# Patient Record
Sex: Male | Born: 1980 | Race: Black or African American | Hispanic: No | Marital: Single | Smoking: Current every day smoker
Health system: Southern US, Community
[De-identification: ages and names within clinical notes are randomized; demographics above are authoritative.]

## PROBLEM LIST (undated history)

## (undated) DIAGNOSIS — S2249XA Multiple fractures of ribs, unspecified side, initial encounter for closed fracture: Secondary | ICD-10-CM

## (undated) DIAGNOSIS — S2239XA Fracture of one rib, unspecified side, initial encounter for closed fracture: Secondary | ICD-10-CM

---

## 2006-04-02 ENCOUNTER — Emergency Department: Payer: Self-pay | Admitting: Emergency Medicine

## 2006-08-05 ENCOUNTER — Emergency Department: Payer: Self-pay | Admitting: Emergency Medicine

## 2006-12-04 ENCOUNTER — Emergency Department: Payer: Self-pay | Admitting: Emergency Medicine

## 2007-04-09 ENCOUNTER — Emergency Department: Payer: Self-pay | Admitting: Emergency Medicine

## 2007-08-25 ENCOUNTER — Emergency Department: Payer: Self-pay | Admitting: Internal Medicine

## 2007-09-01 ENCOUNTER — Emergency Department: Payer: Self-pay | Admitting: Internal Medicine

## 2008-01-17 ENCOUNTER — Emergency Department: Payer: Self-pay | Admitting: Emergency Medicine

## 2008-10-21 ENCOUNTER — Emergency Department: Payer: Self-pay | Admitting: Emergency Medicine

## 2009-05-22 ENCOUNTER — Emergency Department: Payer: Self-pay | Admitting: Unknown Physician Specialty

## 2009-08-07 ENCOUNTER — Emergency Department: Payer: Self-pay | Admitting: Emergency Medicine

## 2010-08-14 ENCOUNTER — Emergency Department: Payer: Self-pay | Admitting: Emergency Medicine

## 2013-10-28 ENCOUNTER — Emergency Department: Payer: Self-pay | Admitting: Emergency Medicine

## 2013-11-04 ENCOUNTER — Emergency Department: Payer: Self-pay | Admitting: Emergency Medicine

## 2014-09-10 ENCOUNTER — Emergency Department: Payer: Self-pay | Admitting: Emergency Medicine

## 2015-06-01 ENCOUNTER — Encounter: Payer: Self-pay | Admitting: *Deleted

## 2015-06-01 DIAGNOSIS — K0889 Other specified disorders of teeth and supporting structures: Secondary | ICD-10-CM | POA: Insufficient documentation

## 2015-06-01 DIAGNOSIS — Z720 Tobacco use: Secondary | ICD-10-CM | POA: Insufficient documentation

## 2015-06-01 DIAGNOSIS — K029 Dental caries, unspecified: Secondary | ICD-10-CM | POA: Insufficient documentation

## 2015-06-01 DIAGNOSIS — K0501 Acute gingivitis, non-plaque induced: Secondary | ICD-10-CM | POA: Insufficient documentation

## 2015-06-01 NOTE — ED Notes (Signed)
Pt has left lower toothache since yesterday.  Pt taking otc meds without relief.  Pt states he took 3 percocets tonight without relief.

## 2015-06-02 ENCOUNTER — Emergency Department
Admission: EM | Admit: 2015-06-02 | Discharge: 2015-06-02 | Disposition: A | Discharge status: Home / Self Care | Payer: Self-pay | Attending: Emergency Medicine | Admitting: Emergency Medicine

## 2015-06-02 ENCOUNTER — Encounter: Payer: Self-pay | Admitting: Emergency Medicine

## 2015-06-02 DIAGNOSIS — K0889 Other specified disorders of teeth and supporting structures: Secondary | ICD-10-CM

## 2015-06-02 DIAGNOSIS — K029 Dental caries, unspecified: Secondary | ICD-10-CM

## 2015-06-02 MED ORDER — LIDOCAINE VISCOUS 2 % MT SOLN
OROMUCOSAL | Status: AC
  Administered 2015-06-02: 15 mL via OROMUCOSAL
  Filled 2015-06-02: qty 45

## 2015-06-02 MED ORDER — KETOROLAC TROMETHAMINE 10 MG PO TABS
10.0000 mg | ORAL_TABLET | Freq: Once | ORAL | Status: AC
  Administered 2015-06-02: 10 mg via ORAL
  Filled 2015-06-02: qty 1

## 2015-06-02 MED ORDER — AMOXICILLIN-POT CLAVULANATE 875-125 MG PO TABS
1.0000 | ORAL_TABLET | Freq: Once | ORAL | Status: AC
  Administered 2015-06-02: 1 via ORAL

## 2015-06-02 MED ORDER — LIDOCAINE VISCOUS 2 % MT SOLN
15.0000 mL | Freq: Once | OROMUCOSAL | Status: AC
  Administered 2015-06-02: 15 mL via OROMUCOSAL

## 2015-06-02 MED ORDER — KETOROLAC TROMETHAMINE 10 MG PO TABS: 10.0000 mg | ORAL_TABLET | Freq: Three times a day (TID) | ORAL | Status: DC | PRN

## 2015-06-02 MED ORDER — HYDROCODONE-ACETAMINOPHEN 5-325 MG PO TABS
ORAL_TABLET | ORAL | Status: AC
  Administered 2015-06-02: 2 via ORAL
  Filled 2015-06-02: qty 2

## 2015-06-02 MED ORDER — AMOXICILLIN-POT CLAVULANATE 875-125 MG PO TABS: 1.0000 | ORAL_TABLET | Freq: Two times a day (BID) | ORAL | Status: DC

## 2015-06-02 MED ORDER — HYDROCODONE-ACETAMINOPHEN 5-325 MG PO TABS
2.0000 | ORAL_TABLET | Freq: Once | ORAL | Status: AC
  Administered 2015-06-02: 2 via ORAL

## 2015-06-02 MED ORDER — AMOXICILLIN-POT CLAVULANATE 875-125 MG PO TABS
ORAL_TABLET | ORAL | Status: AC
  Administered 2015-06-02: 1 via ORAL
  Filled 2015-06-02: qty 1

## 2015-06-02 NOTE — ED Notes (Signed)
Pt in with co toothache since Tuesday, states he has been told it needs to be extracted but has not been able to see dentist.

## 2015-06-02 NOTE — Discharge Instructions (Signed)

## 2015-06-02 NOTE — ED Provider Notes (Signed)
[facility] Emergency Department Provider Note  ____________________________________________  Time seen: 1:45 AM  I have reviewed the triage vital signs and the nursing notes.   HISTORY  Chief Complaint Dental Pain      HPI Gregory Palmer is a 34 y.o. male presents with history of left mandibular molar pain times one day without relief patient states he took 3 Percocets which rendered no relief. She denies any fever no nausea or vomiting     Past medical history None There are no active problems to display for this patient.  Surgical history None No current outpatient prescriptions on file.  Allergies No known drug allergies No family history on file.  Social History Social History  Substance Use Topics  . Smoking status: Current Every Day Smoker  . Smokeless tobacco: None  . Alcohol Use: Yes    Review of Systems  Constitutional: Negative for fever. Eyes: Negative for visual changes. ENT: Negative for sore throat. Positive for dental pain Cardiovascular: Negative for chest pain. Respiratory: Negative for shortness of breath. Gastrointestinal: Negative for abdominal pain, vomiting and diarrhea. Genitourinary: Negative for dysuria. Musculoskeletal: Negative for back pain. Skin: Negative for rash. Neurological: Negative for headaches, focal weakness or numbness.   10-point ROS otherwise negative.  ____________________________________________   PHYSICAL EXAM:  VITAL SIGNS: ED Triage Vitals  Enc Vitals Group     BP 06/01/15 2354 138/90 mmHg     Pulse Rate 06/01/15 2354 60     Resp 06/01/15 2354 18     Temp 06/01/15 2354 97.5 F (36.4 C)     Temp Source 06/01/15 2354 Oral     SpO2 06/01/15 2354 95 %     Weight 06/01/15 2354 150 lb (68.04 kg)     Height 06/01/15 2354 5\' 6"  (1.676 m)     Head Cir --      Peak Flow --      Pain Score 06/01/15 2352 10     Pain Loc --      Pain Edu? --      Excl. in GC? --      Constitutional: Alert and oriented. Well appearing and in no distress. Eyes: Conjunctivae are normal. PERRL. Normal extraocular movements. ENT   Head: Normocephalic and atraumatic.   Nose: No congestion/rhinnorhea.   Mouth/Throat: Mucous membranes are moist. Positive gingivitis. Erythema at the base of the posterior left molar   Neck: No stridor. Hematological/Lymphatic/Immunilogical: No cervical lymphadenopathy. Cardiovascular: Normal rate, regular rhythm. Normal and symmetric distal pulses are present in all extremities. No murmurs, rubs, or gallops. Respiratory: Normal respiratory effort without tachypnea nor retractions. Breath sounds are clear and equal bilaterally. No wheezes/rales/rhonchi. Gastrointestinal: Soft and nontender. No distention. There is no CVA tenderness. Genitourinary: deferred Musculoskeletal: Nontender with normal range of motion in all extremities. No joint effusions.  No lower extremity tenderness nor edema. Neurologic:  Normal speech and language. No gross focal neurologic deficits are appreciated. Speech is normal.  Skin:  Skin is warm, dry and intact. No rash noted. Psychiatric: Mood and affect are normal. Speech and behavior are normal. Patient exhibits appropriate insight and judgment.     INITIAL IMPRESSION / ASSESSMENT AND PLAN / ED COURSE  Pertinent labs & imaging results that were available during my care of the patient were reviewed by me and considered in my medical decision making (see chart for details).    ____________________________________________   FINAL CLINICAL IMPRESSION(S) / ED DIAGNOSES  Final diagnoses:  Pain, dental  Dental caries  Darci Current, MD 06/03/15 [phone]

## 2016-08-22 ENCOUNTER — Emergency Department
Admission: EM | Admit: 2016-08-22 | Discharge: 2016-08-22 | Disposition: A | Discharge status: Home / Self Care | Payer: Self-pay | Attending: Emergency Medicine | Admitting: Emergency Medicine

## 2016-08-22 ENCOUNTER — Emergency Department: Payer: Self-pay

## 2016-08-22 DIAGNOSIS — F17200 Nicotine dependence, unspecified, uncomplicated: Secondary | ICD-10-CM | POA: Insufficient documentation

## 2016-08-22 DIAGNOSIS — Y929 Unspecified place or not applicable: Secondary | ICD-10-CM | POA: Insufficient documentation

## 2016-08-22 DIAGNOSIS — Y999 Unspecified external cause status: Secondary | ICD-10-CM | POA: Insufficient documentation

## 2016-08-22 DIAGNOSIS — Z791 Long term (current) use of non-steroidal anti-inflammatories (NSAID): Secondary | ICD-10-CM | POA: Insufficient documentation

## 2016-08-22 DIAGNOSIS — S89301A Unspecified physeal fracture of lower end of right fibula, initial encounter for closed fracture: Secondary | ICD-10-CM | POA: Insufficient documentation

## 2016-08-22 DIAGNOSIS — Y9389 Activity, other specified: Secondary | ICD-10-CM | POA: Insufficient documentation

## 2016-08-22 DIAGNOSIS — S82831A Other fracture of upper and lower end of right fibula, initial encounter for closed fracture: Secondary | ICD-10-CM

## 2016-08-22 DIAGNOSIS — W228XXA Striking against or struck by other objects, initial encounter: Secondary | ICD-10-CM | POA: Insufficient documentation

## 2016-08-22 MED ORDER — HYDROCODONE-ACETAMINOPHEN 5-325 MG PO TABS
1.0000 | ORAL_TABLET | Freq: Once | ORAL | Status: AC
  Administered 2016-08-22: 1 via ORAL
  Filled 2016-08-22: qty 1

## 2016-08-22 MED ORDER — TRAMADOL HCL 50 MG PO TABS
50.0000 mg | ORAL_TABLET | Freq: Four times a day (QID) | ORAL | 0 refills | Status: AC | PRN
Start: 2016-08-22 — End: 2017-08-22
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
## 2016-11-28 ENCOUNTER — Emergency Department
Admission: EM | Admit: 2016-11-28 | Discharge: 2016-11-28 | Disposition: A | Discharge status: Home / Self Care | Payer: Self-pay | Attending: Student in an Organized Health Care Education/Training Program | Admitting: Student in an Organized Health Care Education/Training Program

## 2016-11-28 ENCOUNTER — Encounter: Payer: Self-pay | Admitting: *Deleted

## 2016-11-28 ENCOUNTER — Emergency Department: Payer: Self-pay

## 2016-11-28 DIAGNOSIS — R51 Headache: Secondary | ICD-10-CM | POA: Insufficient documentation

## 2016-11-28 DIAGNOSIS — R519 Headache, unspecified: Secondary | ICD-10-CM

## 2016-11-28 DIAGNOSIS — R42 Dizziness and giddiness: Secondary | ICD-10-CM | POA: Insufficient documentation

## 2016-11-28 DIAGNOSIS — F17200 Nicotine dependence, unspecified, uncomplicated: Secondary | ICD-10-CM | POA: Insufficient documentation

## 2016-11-28 LAB — DIFFERENTIAL
BASOS ABS: 0.1 10*3/uL (ref 0–0.1)
BASOS PCT: 0 %
EOS ABS: 0.1 10*3/uL (ref 0–0.7)
Eosinophils Relative: 1 %
LYMPHS ABS: 1.8 10*3/uL (ref 1.0–3.6)
Lymphocytes Relative: 10 %
Monocytes Absolute: 1 10*3/uL (ref 0.2–1.0)
Monocytes Relative: 6 %
NEUTROS PCT: 83 %
Neutro Abs: 14.6 10*3/uL — ABNORMAL HIGH (ref 1.4–6.5)

## 2016-11-28 LAB — BASIC METABOLIC PANEL
ANION GAP: 11 (ref 5–15)
BUN: 9 mg/dL (ref 6–20)
CALCIUM: 9.3 mg/dL (ref 8.9–10.3)
CO2: 26 mmol/L (ref 22–32)
Chloride: 103 mmol/L (ref 101–111)
Creatinine, Ser: 0.97 mg/dL (ref 0.61–1.24)
GFR calc Af Amer: >60 mL/min (ref >60)
GLUCOSE: 90 mg/dL (ref 65–99)
Potassium: 3.6 mmol/L (ref 3.5–5.1)
SODIUM: 140 mmol/L (ref 135–145)

## 2016-11-28 LAB — CBC
HCT: 43.4 % (ref 40.0–52.0)
HEMOGLOBIN: 15.1 g/dL (ref 13.0–18.0)
MCH: 28.1 pg (ref 26.0–34.0)
MCHC: 34.8 g/dL (ref 32.0–36.0)
MCV: 80.8 fL (ref 80.0–100.0)
Platelets: 331 10*3/uL (ref 150–440)
RBC: 5.37 MIL/uL (ref 4.40–5.90)
RDW: 15 % — AB (ref 11.5–14.5)
WBC: 17.5 10*3/uL — ABNORMAL HIGH (ref 3.8–10.6)

## 2016-11-28 MED ORDER — PROCHLORPERAZINE EDISYLATE 5 MG/ML IJ SOLN
10.0000 mg | Freq: Once | INTRAMUSCULAR | Status: AC
  Administered 2016-11-28: 10 mg via INTRAMUSCULAR
  Filled 2016-11-28 (×2): qty 2

## 2016-11-28 MED ORDER — KETOROLAC TROMETHAMINE 30 MG/ML IJ SOLN
INTRAMUSCULAR | Status: AC
  Administered 2016-11-28: 15 mg via INTRAVENOUS
  Filled 2016-11-28: qty 1

## 2016-11-28 MED ORDER — AMOXICILLIN-POT CLAVULANATE 875-125 MG PO TABS: 1.0000 | ORAL_TABLET | Freq: Two times a day (BID) | ORAL | 0 refills | Status: DC

## 2016-11-28 MED ORDER — SODIUM CHLORIDE 0.9 % IV BOLUS (SEPSIS)
1000.0000 mL | Freq: Once | INTRAVENOUS | Status: AC
  Administered 2016-11-28: 1000 mL via INTRAVENOUS

## 2016-11-28 MED ORDER — ACETAMINOPHEN 500 MG PO TABS
1000.0000 mg | ORAL_TABLET | Freq: Once | ORAL | Status: AC
  Administered 2016-11-28: 1000 mg via ORAL
  Filled 2016-11-28: qty 2

## 2016-11-28 MED ORDER — DEXAMETHASONE SODIUM PHOSPHATE 10 MG/ML IJ SOLN
INTRAMUSCULAR | Status: AC
  Administered 2016-11-28: 10 mg via INTRAVENOUS
  Filled 2016-11-28: qty 1

## 2016-11-28 MED ORDER — DEXAMETHASONE SODIUM PHOSPHATE 10 MG/ML IJ SOLN
10.0000 mg | Freq: Once | INTRAMUSCULAR | Status: AC
  Administered 2016-11-28: 10 mg via INTRAVENOUS

## 2016-11-28 MED ORDER — KETOROLAC TROMETHAMINE 30 MG/ML IJ SOLN
15.0000 mg | Freq: Once | INTRAMUSCULAR | Status: AC
  Administered 2016-11-28: 15 mg via INTRAVENOUS

## 2016-11-28 NOTE — Discharge Instructions (Signed)

## 2016-11-28 NOTE — ED Provider Notes (Signed)
[facility] Emergency Department Provider Note    First MD Initiated Contact with Patient 11/28/16 1600     (approximate)  I have reviewed the triage vital signs and the nursing notes.   HISTORY  Chief Complaint Headache    HPI Gregory Palmer is a 36 y.o. male presents with headache and dizziness that started this morning. Was associated with feeling sweaty. Denies any head injury. No blurry vision. States the headache is in the frontal aspect. No eye pain. Is having increased nasal congestion. No cough. No sore throat. No history of headaches. Denies any numbness or tingling. No history of diabetes.   History reviewed. No pertinent past medical history. FMH: no bleeding disorders History reviewed. No pertinent surgical history. There are no active problems to display for this patient.     Prior to Admission medications   Medication Sig Start Date End Date Taking? Authorizing Provider  amoxicillin-clavulanate (AUGMENTIN) 875-125 MG tablet Take 1 tablet by mouth 2 (two) times daily. 11/28/16   Willy Eddyobinson, Zema Lizardo, MD  ketorolac (TORADOL) 10 MG tablet Take 1 tablet (10 mg total) by mouth every 8 (eight) hours as needed. 06/02/15   Darci CurrentBrown, [place] N, MD  traMADol (ULTRAM) 50 MG tablet Take 1 tablet (50 mg total) by mouth every 6 (six) hours as needed. 08/22/16 08/22/17  Enid DerryWagner, Ashley, PA-C    Allergies Patient has no known allergies.    Social History Social History  Substance Use Topics  . Smoking status: Current Every Day Smoker  . Smokeless tobacco: Never Used  . Alcohol use Yes    Review of Systems Patient denies headaches, rhinorrhea, blurry vision, numbness, shortness of breath, chest pain, edema, cough, abdominal pain, nausea, vomiting, diarrhea, dysuria, fevers, rashes or hallucinations unless otherwise stated above in HPI. ____________________________________________   PHYSICAL EXAM:  VITAL SIGNS: Vitals:   11/28/16 1339  BP:  (!) 148/91  Pulse: 78  Resp: 18  Temp: 98.1 F (36.7 C)    Constitutional: Alert and oriented.  in no acute distress. Eyes: Conjunctivae are normal. PERRL. EOMI. Head: A+ congestion/rhinnorhea with boggy nasal turbinates.  No  evidence of cellultiis.  ttp of frontal sinuses Mouth/Throat: Mucous membranes are moist.  Oropharynx non-erythematous. Neck: No stridor. Painless ROM, freely moving head with no discomfort. No cervical spine tenderness to palpation Hematological/Lymphatic/Immunilogical: No cervical lymphadenopathy. Cardiovascular: Normal rate, regular rhythm. Grossly normal heart sounds.  Good peripheral circulation. Respiratory: Normal respiratory effort.  No retractions. Lungs CTAB. Gastrointestinal: Soft and nontender. No distention. No abdominal bruits. No CVA tenderness. Musculoskeletal: No lower extremity tenderness nor edema.  No joint effusions. Neurologic:  CN- intact.  No facial droop, Normal FNF.  Normal heel to shin.  Sensation intact bilaterally. Normal speech and language. No gross focal neurologic deficits are appreciated. No gait instability. Skin:  Skin is warm, dry and intact. No rash noted.   ____________________________________________   LABS (all labs ordered are listed, but only abnormal results are displayed)  Results for orders placed or performed during the hospital encounter of 11/28/16 (from the past 24 hour(s))  Basic metabolic panel     Status: None   Collection Time: 11/28/16  1:36 PM  Result Value Ref Range   Sodium 140 135 - 145 mmol/L   Potassium 3.6 3.5 - 5.1 mmol/L   Chloride 103 101 - 111 mmol/L   CO2 26 22 - 32 mmol/L   Glucose, Bld 90 65 - 99 mg/dL   BUN 9 6 - 20 mg/dL  Creatinine, Ser 0.97 0.61 - 1.24 mg/dL   Calcium 9.3 8.9 - 78.2 mg/dL   GFR calc non Af Amer >60 >60 mL/min   GFR calc Af Amer >60 >60 mL/min   Anion gap 11 5 - 15  CBC     Status: Abnormal   Collection Time: 11/28/16  1:36 PM  Result Value Ref Range   WBC 17.5  (H) 3.8 - 10.6 K/uL   RBC 5.37 4.40 - 5.90 MIL/uL   Hemoglobin 15.1 13.0 - 18.0 g/dL   HCT 95.6 21.3 - 08.6 %   MCV 80.8 80.0 - 100.0 fL   MCH 28.1 26.0 - 34.0 pg   MCHC 34.8 32.0 - 36.0 g/dL   RDW 57.8 (H) 46.9 - 62.9 %   Platelets 331 150 - 440 K/uL  Differential     Status: Abnormal   Collection Time: 11/28/16  1:36 PM  Result Value Ref Range   Neutrophils Relative % 83 %   Neutro Abs 14.6 (H) 1.4 - 6.5 K/uL   Lymphocytes Relative 10 %   Lymphs Abs 1.8 1.0 - 3.6 K/uL   Monocytes Relative 6 %   Monocytes Absolute 1.0 0.2 - 1.0 K/uL   Eosinophils Relative 1 %   Eosinophils Absolute 0.1 0 - 0.7 K/uL   Basophils Relative 0 %   Basophils Absolute 0.1 0 - 0.1 K/uL   ____________________________________________ ____________________________________________  RADIOLOGY  I personally reviewed all radiographic images ordered to evaluate for the above acute complaints and reviewed radiology reports and findings.  These findings were personally discussed with the patient.  Please see medical record for radiology report.  ____________________________________________   PROCEDURES  Procedure(s) performed:  Procedures    Critical Care performed: no ____________________________________________   INITIAL IMPRESSION / ASSESSMENT AND PLAN / ED COURSE  Pertinent labs & imaging results that were available during my care of the patient were reviewed by me and considered in my medical decision making (see chart for details).  DDX: migraine, uri, allergies, sinusitis, ich  Tramain P Remmert is a 36 y.o. who presents to the ED p/w HA for last several hours associated with nasal congeston. Gradual onset.  Denies focal neurologic symptoms. Denies trauma. No fevers or neck pain.  Able to freely move head about with no evidence of meningeal irritation.  No vision loss. Afebrile in ED. VSS. Exam as above. No meningeal signs. No CN, motor, sensory or cerebellar deficits. Temporal arteries  palpable and non-tender. Appears well and non-toxic.  Will provide IV fluids for hydration and IV medications for symptom control.  Ct imaging to eval for ich, sinusitis or mass given his leukocytosis.  Entire CBC does appear concentrated however.  Will reassess with IVF.  Clinical picture is not consistent with ICH, SAH, SDH, EDH, TIA, or CVA. No concern for meningitis or encephalitis. No concern for GCA/Temporal arteritis.   Clinical Course as of Nov 28 1845  Wed Nov 28, 2016  1735 Patient states that headache is completely resolved. We'll continue with IV fluids as a deep feel that there is some component of dehydration. Will continue monitor patient.  [PR]  1844 Patient is relating about ER in no acute distress. Moving his neck again with no distress. Remains afebrile. At this point I do feel reassured that this is more consistent with some component of sinusitis upper respiratory infection. This is not coughing it consistent with meningitis or encephalitis. We'll provide prescription for Augmentin and follow-up.  Patient was able to tolerate PO and  was able to ambulate with a steady gait.  Have discussed with the patient and available family all diagnostics and treatments performed thus far and all questions were answered to the best of my ability. The patient demonstrates understanding and agreement with plan.   [PR]    Clinical Course User Index [PR] Willy Eddy, MD     ____________________________________________   FINAL CLINICAL IMPRESSION(S) / ED DIAGNOSES  Final diagnoses:  Acute nonintractable headache, unspecified headache type      NEW MEDICATIONS STARTED DURING THIS VISIT:  Current Discharge Medication List       Note:  This document was prepared using Dragon voice recognition software and may include unintentional dictation errors.    Willy Eddy, MD 11/28/16 [phone]

## 2016-11-28 NOTE — ED Notes (Signed)
Pt with s/s of head cold.

## 2016-11-28 NOTE — ED Triage Notes (Addendum)
Pt states headache with dizziness and sweating that began this AM, denies any ehad injury or blurry vision, denies any ETOH or drug abuse,  awake and alert

## 2017-01-28 ENCOUNTER — Encounter: Payer: Self-pay | Admitting: Emergency Medicine

## 2017-01-28 ENCOUNTER — Emergency Department
Admission: EM | Admit: 2017-01-28 | Discharge: 2017-01-28 | Disposition: A | Discharge status: Home / Self Care | Payer: Self-pay | Attending: Emergency Medicine | Admitting: Emergency Medicine

## 2017-01-28 ENCOUNTER — Emergency Department: Payer: Self-pay

## 2017-01-28 DIAGNOSIS — R0789 Other chest pain: Secondary | ICD-10-CM | POA: Insufficient documentation

## 2017-01-28 DIAGNOSIS — R079 Chest pain, unspecified: Secondary | ICD-10-CM

## 2017-01-28 DIAGNOSIS — F17200 Nicotine dependence, unspecified, uncomplicated: Secondary | ICD-10-CM | POA: Insufficient documentation

## 2017-01-28 DIAGNOSIS — Z79899 Other long term (current) drug therapy: Secondary | ICD-10-CM | POA: Insufficient documentation

## 2017-01-28 LAB — CBC
HCT: 42.9 % (ref 40.0–52.0)
HEMOGLOBIN: 14.8 g/dL (ref 13.0–18.0)
MCH: 28.9 pg (ref 26.0–34.0)
MCHC: 34.5 g/dL (ref 32.0–36.0)
MCV: 83.9 fL (ref 80.0–100.0)
Platelets: 317 10*3/uL (ref 150–440)
RBC: 5.11 MIL/uL (ref 4.40–5.90)
RDW: 14 % (ref 11.5–14.5)
WBC: 11.4 10*3/uL — ABNORMAL HIGH (ref 3.8–10.6)

## 2017-01-28 LAB — BASIC METABOLIC PANEL
ANION GAP: 9 (ref 5–15)
BUN: 12 mg/dL (ref 6–20)
CHLORIDE: 105 mmol/L (ref 101–111)
CO2: 25 mmol/L (ref 22–32)
Calcium: 9.2 mg/dL (ref 8.9–10.3)
Creatinine, Ser: 0.99 mg/dL (ref 0.61–1.24)
GFR calc non Af Amer: >60 mL/min (ref >60)
Glucose, Bld: 138 mg/dL — ABNORMAL HIGH (ref 65–99)
Potassium: 3.4 mmol/L — ABNORMAL LOW (ref 3.5–5.1)
Sodium: 139 mmol/L (ref 135–145)

## 2017-01-28 LAB — TROPONIN I: Troponin I: <0.03 ng/mL (ref <0.03)

## 2017-01-28 NOTE — ED Triage Notes (Signed)
Pt c/o episode central chest pain this AM. No associated sx.  Denies pain now. Denies injury.  Denies cough.  ambulatory to triage.  NAD. VSS

## 2017-01-28 NOTE — ED Provider Notes (Signed)
[facility] Emergency Department Provider Note  ____________________________________________  Time seen: Approximately 6:02 PM  I have reviewed the triage vital signs and the nursing notes.   HISTORY  Chief Complaint Chest Pain    HPI Gregory Palmer is a 36 y.o. male who complains of being awakened this morning by central chest pain at about 7:00 AM. Resolved about 30 minutes later. At the time it was worsened by moving his arm. He cannot remember which aren't was. His significant other who is present with him in the emergency department says that he was sleeping in a funny position and thinks that's what caused the pain. Pain was nonradiating, no associated shortness of breath diaphoresis vomiting or dizziness. Not exertional and not pleuritic. It has not recurred, since this morning he's been symptom free and is comes to be checked out.     History reviewed. No pertinent past medical history.   There are no active problems to display for this patient.    History reviewed. No pertinent surgical history.   Prior to Admission medications   Medication Sig Start Date End Date Taking? Authorizing Provider  amoxicillin-clavulanate (AUGMENTIN) 875-125 MG tablet Take 1 tablet by mouth 2 (two) times daily. 11/28/16   Willy Eddy, MD  ketorolac (TORADOL) 10 MG tablet Take 1 tablet (10 mg total) by mouth every 8 (eight) hours as needed. 06/02/15   Darci Current, MD  traMADol (ULTRAM) 50 MG tablet Take 1 tablet (50 mg total) by mouth every 6 (six) hours as needed. 08/22/16 08/22/17  Enid Derry, PA-C     Allergies Patient has no known allergies.   History reviewed. No pertinent family history.  Social History Social History  Substance Use Topics  . Smoking status: Current Every Day Smoker  . Smokeless tobacco: Never Used  . Alcohol use Yes    Review of Systems  Constitutional:   No fever or chills.  ENT:   No sore throat. No  rhinorrhea. Cardiovascular:   Positive as above for chest pain without syncope. Respiratory:   No dyspnea or cough. Gastrointestinal:   Negative for abdominal pain, vomiting and diarrhea.  Musculoskeletal:   Negative for focal pain or swelling All other systems reviewed and are negative except as documented above in ROS and HPI.  ____________________________________________   PHYSICAL EXAM:  VITAL SIGNS: ED Triage Vitals [01/28/17 1522]  Enc Vitals Group     BP (!) 142/89     Pulse Rate 88     Resp 16     Temp 98.4 F (36.9 C)     Temp Source Oral     SpO2 99 %     Weight 150 lb (68 kg)     Height 5\' 5"  (1.651 m)     Head Circumference      Peak Flow      Pain Score      Pain Loc      Pain Edu?      Excl. in GC?     Vital signs reviewed, nursing assessments reviewed.   Constitutional:   Alert and oriented. Well appearing and in no distress. Eyes:   No scleral icterus.  EOMI. No nystagmus. No conjunctival pallor. PERRL. ENT   Head:   Normocephalic and atraumatic.   Nose:   No congestion/rhinnorhea.    Mouth/Throat:   MMM, no pharyngeal erythema. No peritonsillar mass.    Neck:   No meningismus. Full ROM Hematological/Lymphatic/Immunilogical:   No cervical lymphadenopathy. Cardiovascular:  RRR. Symmetric bilateral radial and DP pulses.  No murmurs.  Respiratory:   Normal respiratory effort without tachypnea/retractions. Breath sounds are clear and equal bilaterally. No wheezes/rales/rhonchi. Gastrointestinal:   Soft and nontender. Non distended. There is no CVA tenderness.  No rebound, rigidity, or guarding. Genitourinary:   deferred Musculoskeletal:   Normal range of motion in all extremities. No joint effusions.  No lower extremity tenderness.  No edema. Chest wall nontender Neurologic:   Normal speech and language.  Motor grossly intact. No gross focal neurologic deficits are appreciated.  Skin:    Skin is warm, dry and intact. No rash noted.  No  petechiae, purpura, or bullae.  ____________________________________________    LABS (pertinent positives/negatives) (all labs ordered are listed, but only abnormal results are displayed) Labs Reviewed  BASIC METABOLIC PANEL - Abnormal; Notable for the following:       Result Value   Potassium 3.4 (*)    Glucose, Bld 138 (*)    All other components within normal limits  CBC - Abnormal; Notable for the following:    WBC 11.4 (*)    All other components within normal limits  TROPONIN I   ____________________________________________   EKG  Interpreted by me  Date: 01/28/2017  Rate: 81  Rhythm: normal sinus rhythm  QRS Axis: normal  Intervals: normal  ST/T Wave abnormalities: normal  Conduction Disutrbances: none  Narrative Interpretation: unremarkable      ____________________________________________    RADIOLOGY  Dg Chest 2 View  Result Date: 01/28/2017 CLINICAL DATA:  Chest pain EXAM: CHEST  2 VIEW COMPARISON:  None. FINDINGS: Normal mediastinum and cardiac silhouette. Normal pulmonary vasculature. No evidence of effusion, infiltrate, or pneumothorax. No acute bony abnormality. IMPRESSION: Normal chest radiograph. Electronically Signed   By: Genevive BiStewart  Edmunds M.D.   On: 01/28/2017 15:37    ____________________________________________   PROCEDURES Procedures  ____________________________________________   INITIAL IMPRESSION / ASSESSMENT AND PLAN / ED COURSE  Pertinent labs & imaging results that were available during my care of the patient were reviewed by me and considered in my medical decision making (see chart for details).  Patient presents with vague nonspecific chest pain, low risk. Normal vital signs, pain resolved spontaneously many hours ago. Workup is negative.Considering the patient's symptoms, medical history, and physical examination today, I have low suspicion for ACS, PE, TAD, pneumothorax, carditis, mediastinitis, pneumonia, CHF, or  sepsis.  Counseled to try Tums or Maalox if it happens again, follow up with primary care      ____________________________________________   FINAL CLINICAL IMPRESSION(S) / ED DIAGNOSES  Final diagnoses:  Nonspecific chest pain      New Prescriptions   No medications on file     Portions of this note were generated with dragon dictation software. Dictation errors may occur despite best attempts at proofreading.    Sharman CheekStafford, Valin Massie, MD 01/28/17 [phone]

## 2017-12-04 ENCOUNTER — Emergency Department: Payer: Self-pay

## 2017-12-04 ENCOUNTER — Other Ambulatory Visit: Payer: Self-pay

## 2017-12-04 ENCOUNTER — Encounter: Payer: Self-pay | Admitting: Emergency Medicine

## 2017-12-04 ENCOUNTER — Emergency Department
Admission: EM | Admit: 2017-12-04 | Discharge: 2017-12-04 | Disposition: A | Discharge status: Home / Self Care | Payer: Self-pay | Attending: Emergency Medicine | Admitting: Emergency Medicine

## 2017-12-04 DIAGNOSIS — Y9372 Activity, wrestling: Secondary | ICD-10-CM | POA: Insufficient documentation

## 2017-12-04 DIAGNOSIS — F17200 Nicotine dependence, unspecified, uncomplicated: Secondary | ICD-10-CM | POA: Insufficient documentation

## 2017-12-04 DIAGNOSIS — Y998 Other external cause status: Secondary | ICD-10-CM | POA: Insufficient documentation

## 2017-12-04 DIAGNOSIS — Y929 Unspecified place or not applicable: Secondary | ICD-10-CM | POA: Insufficient documentation

## 2017-12-04 DIAGNOSIS — W228XXA Striking against or struck by other objects, initial encounter: Secondary | ICD-10-CM | POA: Insufficient documentation

## 2017-12-04 DIAGNOSIS — S2232XA Fracture of one rib, left side, initial encounter for closed fracture: Secondary | ICD-10-CM | POA: Insufficient documentation

## 2017-12-04 DIAGNOSIS — Z79899 Other long term (current) drug therapy: Secondary | ICD-10-CM | POA: Insufficient documentation

## 2017-12-04 MED ORDER — LIDOCAINE 5 % EX PTCH
1.0000 | MEDICATED_PATCH | CUTANEOUS | Status: DC
  Administered 2017-12-04: 1 via TRANSDERMAL
  Filled 2017-12-04: qty 1

## 2017-12-04 MED ORDER — KETOROLAC TROMETHAMINE 10 MG PO TABS: 10.0000 mg | ORAL_TABLET | Freq: Four times a day (QID) | ORAL | 0 refills | Status: DC | PRN

## 2017-12-04 MED ORDER — KETOROLAC TROMETHAMINE 30 MG/ML IJ SOLN
30.0000 mg | Freq: Once | INTRAMUSCULAR | Status: AC
  Administered 2017-12-04: 30 mg via INTRAMUSCULAR
  Filled 2017-12-04: qty 1

## 2017-12-04 NOTE — ED Triage Notes (Signed)
Pt states rib pain on his left side hurting since Friday. States he was wrestling when pain began. Appears in no distress.

## 2017-12-04 NOTE — ED Notes (Signed)
See triage note  States he was wrestling on Friday  And hit his left lateral rib area on rail  conts to have pain

## 2017-12-04 NOTE — ED Provider Notes (Signed)
[facility] Emergency Department Provider Note  ____________________________________________  Time seen: Approximately 10:54 AM  I have reviewed the triage vital signs and the nursing notes.   HISTORY  Chief Complaint Rib injury    HPI Gregory Palmer is a 37 y.o. male thatpresents to the emergency department for evaluation of left rib pain after wrestling with his "homeboys" on Friday. He states that his ribs hit the rail.  Pain is over the front of his left ribs.  He has taken Tylenol for pain, which has not helped.  No additional injuries.  No shortness of breath, chest pain.   History reviewed. No pertinent past medical history.  There are no active problems to display for this patient.   History reviewed. No pertinent surgical history.  Prior to Admission medications   Medication Sig Start Date End Date Taking? Authorizing Provider  amoxicillin-clavulanate (AUGMENTIN) 875-125 MG tablet Take 1 tablet by mouth 2 (two) times daily. 11/28/16   Willy Eddy, MD  ketorolac (TORADOL) 10 MG tablet Take 1 tablet (10 mg total) by mouth every 6 (six) hours as needed. 12/04/17   Enid Derry, PA-C    Allergies Patient has no known allergies.  No family history on file.  Social History Social History   Tobacco Use  . Smoking status: Current Every Day Smoker  . Smokeless tobacco: Never Used  Substance Use Topics  . Alcohol use: Yes  . Drug use: Yes    Types: Marijuana     Review of Systems  ENT: No upper respiratory complaints. Cardiovascular: No chest pain. Respiratory:  No SOB. Gastrointestinal: No abdominal pain.  No nausea, no vomiting.  Musculoskeletal: Positive for rib pain.  Skin: Negative for rash, abrasions, lacerations, ecchymosis.   ____________________________________________   PHYSICAL EXAM:  VITAL SIGNS: ED Triage Vitals  Enc Vitals Group     BP 12/04/17 0928 123/85     Pulse Rate 12/04/17 0928 78     Resp  12/04/17 0928 18     Temp 12/04/17 0928 97.8 F (36.6 C)     Temp Source 12/04/17 0928 Oral     SpO2 12/04/17 0928 99 %     Weight 12/04/17 0932 130 lb (59 kg)     Height 12/04/17 0932  (1.651 m)     Head Circumference --      Peak Flow --      Pain Score 12/04/17 0932 10     Pain Loc --      Pain Edu? --      Excl. in GC? --      Constitutional: Alert and oriented. Well appearing and in no acute distress. Eyes: Conjunctivae are normal. PERRL. EOMI. Head: Atraumatic. ENT:      Ears:      Nose: No congestion/rhinnorhea.      Mouth/Throat: Mucous membranes are moist.  Neck: No stridor.   Cardiovascular: Normal rate, regular rhythm.  Good peripheral circulation. Respiratory: Normal respiratory effort without tachypnea or retractions. Lungs CTAB. Good air entry to the bases with no decreased or absent breath sounds. Gastrointestinal: Bowel sounds 4 quadrants. Soft and nontender to palpation. No guarding or rigidity. No palpable masses. No distention.  Musculoskeletal: Full range of motion to all extremities. No gross deformities appreciated.  Tenderness to palpation over left anterior inferior rib cage.  No ecchymosis.  Neurologic:  Normal speech and language. No gross focal neurologic deficits are appreciated.  Skin:  Skin is warm, dry and intact. No rash noted. Psychiatric:  Mood and affect are normal. Speech and behavior are normal. Patient exhibits appropriate insight and judgement.   ____________________________________________   LABS (all labs ordered are listed, but only abnormal results are displayed)  Labs Reviewed - No data to display ____________________________________________  EKG   ____________________________________________  RADIOLOGY Lexine Baton, personally viewed and evaluated these images (plain radiographs) as part of my medical decision making, as well as reviewing the written report by the radiologist.  Dg Ribs Unilateral W/chest  Left  Result Date: 12/04/2017 CLINICAL DATA:  Left lower anterior rib pain for 5 days, injured wrestling, smoking history EXAM: LEFT RIBS AND CHEST - 3+ VIEW COMPARISON:  Chest x-ray of 01/28/2017 FINDINGS: No active infiltrate or effusion is seen. Mediastinal and hilar contours are unremarkable. The heart is within normal limits in size. On left rib detail films the only questionable abnormality is on 1 oblique view where there is slightly displaced costochondral calcification noted from the anterior left eleventh rib which could represent subtle fracture. No other rib abnormality is seen. IMPRESSION: 1. Probable minimally displaced fracture of the anterior most left eleventh rib at the costochondral junction. No other rib abnormality. 2. No active lung disease. Electronically Signed   By: Dwyane Dee M.D.   On: 12/04/2017 10:37    ____________________________________________    PROCEDURES  Procedure(s) performed:    Procedures    Medications  lidocaine (LIDODERM) 5 % 1 patch (1 patch Transdermal Patch Applied 12/04/17 1108)  ketorolac (TORADOL) 30 MG/ML injection 30 mg (30 mg Intramuscular Given 12/04/17 1108)     ____________________________________________   INITIAL IMPRESSION / ASSESSMENT AND PLAN / ED COURSE  Pertinent labs & imaging results that were available during my care of the patient were reviewed by me and considered in my medical decision making (see chart for details).  Review of the [place] CSRS was performed in accordance of the [facility] prior to dispensing any controlled drugs.  Patient's diagnosis is consistent with rib fracture.  Vital signs and exam are reassuring.  IM Toradol was given.  Patient will be discharged home with prescriptions for Toradol. Patient is to follow up with PCP as directed. Patient is given ED precautions to return to the ED for any worsening or new symptoms.     ____________________________________________  FINAL CLINICAL IMPRESSION(S) / ED  DIAGNOSES  Final diagnoses:  Closed fracture of one rib of left side, initial encounter      NEW MEDICATIONS STARTED DURING THIS VISIT:  ED Discharge Orders        Ordered    ketorolac (TORADOL) 10 MG tablet  Every 6 hours PRN     12/04/17 1057          This chart was dictated using voice recognition software/Dragon. Despite best efforts to proofread, errors can occur which can change the meaning. Any change was purely unintentional.    Enid Derry, PA-C 12/04/17 1222    Emily Filbert, MD 12/04/17 [phone]

## 2018-03-07 ENCOUNTER — Encounter: Payer: Self-pay | Admitting: Emergency Medicine

## 2018-03-07 ENCOUNTER — Emergency Department: Payer: Self-pay

## 2018-03-07 ENCOUNTER — Emergency Department
Admission: EM | Admit: 2018-03-07 | Discharge: 2018-03-07 | Disposition: A | Discharge status: Home / Self Care | Payer: Self-pay | Attending: Emergency Medicine | Admitting: Emergency Medicine

## 2018-03-07 ENCOUNTER — Other Ambulatory Visit: Payer: Self-pay

## 2018-03-07 DIAGNOSIS — F17200 Nicotine dependence, unspecified, uncomplicated: Secondary | ICD-10-CM | POA: Insufficient documentation

## 2018-03-07 DIAGNOSIS — X58XXXA Exposure to other specified factors, initial encounter: Secondary | ICD-10-CM | POA: Insufficient documentation

## 2018-03-07 DIAGNOSIS — S2232XD Fracture of one rib, left side, subsequent encounter for fracture with routine healing: Secondary | ICD-10-CM | POA: Insufficient documentation

## 2018-03-07 HISTORY — DX: Fracture of one rib, unspecified side, initial encounter for closed fracture: S22.39XA

## 2018-03-07 HISTORY — DX: Multiple fractures of ribs, unspecified side, initial encounter for closed fracture: S22.49XA

## 2018-03-07 MED ORDER — NAPROXEN 500 MG PO TABS: 500.0000 mg | ORAL_TABLET | Freq: Two times a day (BID) | ORAL | 0 refills | Status: DC

## 2018-03-07 NOTE — ED Notes (Signed)
See triage note  States he was told that he had a fx rib in May  States he is still having pain to area  denies new injury

## 2018-03-07 NOTE — ED Triage Notes (Signed)
Says he has fx rib on left side and it should be healed by now.  Says it still has a knot on it.

## 2018-03-09 NOTE — ED Provider Notes (Signed)
[facility] Emergency Department Provider Note ____________________________________________  Time seen: Approximately 8:31 AM  I have reviewed the triage vital signs and the nursing notes.   HISTORY  Chief Complaint Rib Injury    HPI Vilma MeckelKendrick P Keniston is a 37 y.o. male who presents to the emergency department for evaluation and treatment of left rib pain. He had a rib fracture several months ago and still has a "bump" that hurts. No new injury. He has not taken anything for pain.  Past Medical History:  Diagnosis Date  . Rib fractures     There are no active problems to display for this patient.   History reviewed. No pertinent surgical history.  Prior to Admission medications   Medication Sig Start Date End Date Taking? Authorizing Provider  amoxicillin-clavulanate (AUGMENTIN) 875-125 MG tablet Take 1 tablet by mouth 2 (two) times daily. 11/28/16   Willy Eddyobinson, Patrick, MD  ketorolac (TORADOL) 10 MG tablet Take 1 tablet (10 mg total) by mouth every 6 (six) hours as needed. 12/04/17   Enid DerryWagner, Ashley, PA-C  naproxen (NAPROSYN) 500 MG tablet Take 1 tablet (500 mg total) by mouth 2 (two) times daily with a meal. 03/07/18   Galen Malkowski B, FNP    Allergies Patient has no known allergies.  No family history on file.  Social History Social History   Tobacco Use  . Smoking status: Current Every Day Smoker  . Smokeless tobacco: Never Used  Substance Use Topics  . Alcohol use: Yes  . Drug use: Yes    Types: Marijuana    Review of Systems Constitutional: Negative for fever. Cardiovascular: Negative for chest pain. Respiratory: Negative for shortness of breath. Musculoskeletal: Positive for left rib pain. Skin: Negative for open wound or lesion.  Neurological: Negative for decrease in sensation  ____________________________________________   PHYSICAL EXAM:  VITAL SIGNS: ED Triage Vitals  Enc Vitals Group     BP 03/07/18 0859 136/73      Pulse Rate 03/07/18 0859 (!) 55     Resp 03/07/18 0859 18     Temp 03/07/18 0859 98.1 F (36.7 C)     Temp Source 03/07/18 0859 Oral     SpO2 03/07/18 0859 100 %     Weight 03/07/18 0923 130 lb 1.1 oz (59 kg)     Height 03/07/18 0923 5\' 5"  (1.651 m)     Head Circumference --      Peak Flow --      Pain Score 03/07/18 0850 0     Pain Loc --      Pain Edu? --      Excl. in GC? --     Constitutional: Alert and oriented. Well appearing and in no acute distress. Eyes: Conjunctivae are clear without discharge or drainage Head: Atraumatic Neck: Supple Respiratory: No cough. Respirations are even and unlabored. Musculoskeletal: Positive for focal tenderness on the left anterior lower rib area. Neurologic: Awake, alert, and oriented.  Skin: No wound or lesion on exposed skin surface.  Psychiatric: Affect and behavior are appropriate.  ____________________________________________   LABS (all labs ordered are listed, but only abnormal results are displayed)  Labs Reviewed - No data to display ____________________________________________  RADIOLOGY  Healing non displaced 11th rib fracture. ____________________________________________   PROCEDURES  Procedures  ____________________________________________   INITIAL IMPRESSION / ASSESSMENT AND PLAN / ED COURSE  Tecumseh P Jager is a 37 y.o. who presents to the emergency department for evaluation of left rib pain. No acute findings on x-ray.  He will be given a prescription for naprosyn and advised to establish a primary care provider.  Medications - No data to display  Pertinent labs & imaging results that were available during my care of the patient were reviewed by me and considered in my medical decision making (see chart for details).  _________________________________________   FINAL CLINICAL IMPRESSION(S) / ED DIAGNOSES  Final diagnoses:  Closed fracture of one rib of left side with routine healing, subsequent  encounter    ED Discharge Orders         Ordered    naproxen (NAPROSYN) 500 MG tablet  2 times daily with meals     03/07/18 1030           If controlled substance prescribed during this visit, 12 month history viewed on the [place] prior to issuing an initial prescription for Schedule II or III opiod.    Chinita Pesterriplett, Kolbey Teichert B, FNP 03/09/18 [phone]    Dionne BucySiadecki, Sebastian, MD 03/12/18 2340

## 2018-05-06 ENCOUNTER — Other Ambulatory Visit: Payer: Self-pay

## 2018-05-06 ENCOUNTER — Encounter: Payer: Self-pay | Admitting: Emergency Medicine

## 2018-05-06 ENCOUNTER — Emergency Department
Admission: EM | Admit: 2018-05-06 | Discharge: 2018-05-06 | Disposition: A | Discharge status: Home / Self Care | Payer: Self-pay | Attending: Emergency Medicine | Admitting: Emergency Medicine

## 2018-05-06 DIAGNOSIS — F17200 Nicotine dependence, unspecified, uncomplicated: Secondary | ICD-10-CM | POA: Insufficient documentation

## 2018-05-06 DIAGNOSIS — K0889 Other specified disorders of teeth and supporting structures: Secondary | ICD-10-CM

## 2018-05-06 DIAGNOSIS — K047 Periapical abscess without sinus: Secondary | ICD-10-CM | POA: Insufficient documentation

## 2018-05-06 MED ORDER — TRAMADOL HCL 50 MG PO TABS: 50.0000 mg | ORAL_TABLET | Freq: Four times a day (QID) | ORAL | 0 refills | Status: DC | PRN

## 2018-05-06 MED ORDER — AMOXICILLIN 500 MG PO CAPS: 500.0000 mg | ORAL_CAPSULE | Freq: Three times a day (TID) | ORAL | 0 refills | Status: DC

## 2018-05-06 NOTE — ED Triage Notes (Signed)
Pt reports that he noticed that his left upper mouth started swelling last night. Reports that all of his teeth need to come out. Reports pain is so bad that it is making him sweat.

## 2018-05-06 NOTE — Discharge Instructions (Signed)
OPTIONS FOR DENTAL FOLLOW UP CARE ° °[place] [facility] - Local Safety Net Dental Clinics °http://www.ncdhhs.gov/dph/oralhealth/services/safetynetclinics.htm °  °[facility] (336-562-3123) ° °[facility] [place] (919-933-9087) ° °[facility] ([phone] ext [phone]) ° °[facility] ([phone]) ° °[facility] ([phone]) °This clinic caters to the indigent population and is on a lottery system. °Location: °[facility], [address], [place] °Clinic Hours: °Wednesdays from 6pm - 9pm, patients seen by a lottery system. °For dates, call or go to [email or web address].[facility].edu/[facility]/patients/Dental-[facility] °Services: °Cleanings, fillings and simple extractions. °Payment Options: °DENTAL WORK IS FREE OF CHARGE. Bring proof of income or support. °Best way to get seen: °Arrive at 5:15 pm - this is a lottery, NOT first come/first serve, so arriving earlier will not increase your chances of being seen. °  °  °[facility] °[phone] °Select option 1 for emergencies °  °Location: °[facility], [address], [place] °Clinic Hours: °No walk-ins accepted - call the day before to schedule an appointment. °Check in times are 9:30 am and 1:30 pm. °Services: °Simple extractions, temporary fillings, pulpectomy/pulp debridement, uncomplicated abscess drainage. °Payment Options: °PAYMENT IS DUE AT THE TIME OF SERVICE.  Fee is usually $100-200, additional surgical procedures (e.g. abscess drainage) may be extra. °Cash, checks, Visa/MasterCard accepted.  Can file Medicaid if patient is covered for dental - patient should call case worker to check. °No discount for [facility] patients. °Best way to get seen: °MUST call the day before and get onto the schedule. Can usually be seen the next 1-2 days. No walk-ins accepted. °  °  °[facility] °[phone] °   °Location: °[facility], [address], [place] °Clinic Hours: °M, W, Th, F 8am or 1:30pm, Tues 9a or 1:30 - first come/first served. °Services: °Simple extractions, temporary fillings, uncomplicated abscess drainage.  You do not need to be an [place] resident. °Payment Options: °PAYMENT IS DUE AT THE TIME OF SERVICE. °Dental insurance, otherwise sliding scale - bring proof of income or support. °Depending on income and treatment needed, cost is usually $50-200. °Best way to get seen: °Arrive early as it is first come/first served. °  °  °[facility] °[phone] °  °Location: °[address] °Clinic Hours: °Mon-Thu 8a-5p °Services: °Most basic dental services including extractions and fillings. °Payment Options: °PAYMENT IS DUE AT THE TIME OF SERVICE. °Sliding scale, up to 50% off - bring proof if income or support. °Medicaid with dental option accepted. °Best way to get seen: °Call to schedule an appointment, can usually be seen within 2 weeks OR they will try to see walk-ins - show up at 8a or 2p (you may have to wait). °  °  °[facility] °919-245-2435 °[place] RESIDENTS ONLY °  °Location: °[facility], [address], [place], [place] [address] °Clinic Hours: By appointment only. °Monday - Thursday 8am-5pm, Friday 8am-12pm °Services: Cleanings, fillings, extractions. °Payment Options: °PAYMENT IS DUE AT THE TIME OF SERVICE. °Cash, Visa or MasterCard. Sliding scale - $30 minimum per service. °Best way to get seen: °Come in to office, complete packet and make an appointment - need proof of income °or support monies for each household member and proof of [place] residence. °Usually takes about a month to get in. °  °  °[facility] °[phone] °  °Location: °[address]   [place] °Clinic Hours: Walk-in Urgent Care Dental Services are offered Monday-Friday  mornings only. °The numbers of emergencies accepted daily is limited to the number of °providers available. °Maximum 15 - Mondays, Wednesdays & Thursdays °Maximum 10 - Tuesdays & Fridays °Services: °You do not need to be a  [place] resident to be seen for a dental emergency. °Emergencies are defined as pain, swelling, abnormal bleeding, or dental trauma. Walkins will receive x-rays if needed. °NOTE: Dental cleaning is not an emergency. °Payment Options: °PAYMENT IS DUE AT THE TIME OF SERVICE. °Minimum co-pay is $40.00 for uninsured patients. °Minimum co-pay is $3.00 for Medicaid with dental coverage. °Dental Insurance is accepted and must be presented at time of visit. °Medicare does not cover dental. °Forms of payment: Cash, credit card, checks. °Best way to get seen: °If not previously registered with the clinic, walk-in dental registration begins at 7:15 am and is on a first come/first serve basis. °If previously registered with the clinic, call to make an appointment. °  °  °[facility] °[phone] °[place] RESIDENTS ONLY °  °Location: °[address], [place], [place] °Clinic Hours: °Mon-Thu 10a-2p °Services: Extractions only! °Payment Options: °FREE (donations accepted) - bring proof of income or support °Best way to get seen: °Call and schedule an appointment OR come at 8am on the 1st Monday of every month (except for holidays) when it is first come/first served. °  °  °[facility] °[phone] °  °Location: °[address], [place] °Clinic Hours: °Friday mornings °Services, Payment Options, Best way to get seen: °Call for info °

## 2018-05-06 NOTE — ED Provider Notes (Signed)
[facility] Emergency Department Provider Note  ____________________________________________   First MD Initiated Contact with Patient 05/06/18 2011     (approximate)  I have reviewed the triage vital signs and the nursing notes.   HISTORY  Chief Complaint Dental Pain    HPI HONDO NANDA is a 37 y.o. male presents emergency department complaining of widespread dental pain and swelling that started on left side of his face last night.  He denies any chest pain or shortness of breath.  States he feels achy like he has the flu.  But he does not think he has had a fever.  He denies any vomiting or diarrhea.    Past Medical History:  Diagnosis Date  . Rib fractures     There are no active problems to display for this patient.   History reviewed. No pertinent surgical history.  Prior to Admission medications   Medication Sig Start Date End Date Taking? Authorizing Provider  amoxicillin (AMOXIL) 500 MG capsule Take 1 capsule (500 mg total) by mouth 3 (three) times daily. 05/06/18   Sherrie Mustache Roselyn Bering, PA-C  traMADol (ULTRAM) 50 MG tablet Take 1 tablet (50 mg total) by mouth every 6 (six) hours as needed. 05/06/18   Faythe Ghee, PA-C    Allergies Patient has no known allergies.  History reviewed. No pertinent family history.  Social History Social History   Tobacco Use  . Smoking status: Current Every Day Smoker  . Smokeless tobacco: Never Used  Substance Use Topics  . Alcohol use: Yes  . Drug use: Yes    Types: Marijuana    Review of Systems  Constitutional: No fever/chills Eyes: No visual changes. ENT: No sore throat.  Positive for dental pain and swelling Respiratory: Denies cough Genitourinary: Negative for dysuria. Musculoskeletal: Negative for back pain. Skin: Negative for rash.    ____________________________________________   PHYSICAL EXAM:  VITAL SIGNS: ED Triage Vitals  Enc Vitals Group     BP 05/06/18 2007  (!) 135/101     Pulse Rate 05/06/18 2007 93     Resp 05/06/18 2007 20     Temp 05/06/18 2007 99.8 F (37.7 C)     Temp Source 05/06/18 2007 Oral     SpO2 05/06/18 2007 97 %     Weight 05/06/18 2008 140 lb (63.5 kg)     Height 05/06/18 2008 5\' 5"  (1.651 m)     Head Circumference --      Peak Flow --      Pain Score 05/06/18 2008 10     Pain Loc --      Pain Edu? --      Excl. in GC? --     Constitutional: Alert and oriented. Well appearing and in no acute distress. Eyes: Conjunctivae are normal.  Head: Atraumatic. Nose: No congestion/rhinnorhea. Mouth/Throat: Mucous membranes are moist.   Positive for multiple caries noted.  There is some swelling noted along the left maxillary area. Neck:  supple no lymphadenopathy noted Cardiovascular: Normal rate, regular rhythm. Heart sounds are normal Respiratory: Normal respiratory effort.  No retractions, lungs c t a  GU: deferred Musculoskeletal: FROM all extremities, warm and well perfused Neurologic:  Normal speech and language.  Skin:  Skin is warm, dry and intact. No rash noted. Psychiatric: Mood and affect are normal. Speech and behavior are normal.  ____________________________________________   LABS (all labs ordered are listed, but only abnormal results are displayed)  Labs Reviewed - No data to  display ____________________________________________   ____________________________________________  RADIOLOGY    ____________________________________________   PROCEDURES  Procedure(s) performed: No  Procedures    ____________________________________________   INITIAL IMPRESSION / ASSESSMENT AND PLAN / ED COURSE  Pertinent labs & imaging results that were available during my care of the patient were reviewed by me and considered in my medical decision making (see chart for details).   Patient is 37 year old male presents emergency department complaining of left dental pain.  Multiple caries are noted on  physical exam.  Slight swelling is noted to the left maxillary area.  Explained findings to the patient.  He was given a prescription for amoxicillin and tramadol.  He is to take Tylenol or ibuprofen for pain also.  He is to follow-up with the dental clinics that was provided.  He states he understands will comply.  Was discharged in stable condition.     As part of my medical decision making, I reviewed the following data within the electronic MEDICAL RECORD NUMBER Nursing notes reviewed and incorporated, Notes from prior ED visits and  Controlled Substance Database  ____________________________________________   FINAL CLINICAL IMPRESSION(S) / ED DIAGNOSES  Final diagnoses:  Pain, dental  Dental abscess      NEW MEDICATIONS STARTED DURING THIS VISIT:  New Prescriptions   AMOXICILLIN (AMOXIL) 500 MG CAPSULE    Take 1 capsule (500 mg total) by mouth 3 (three) times daily.   TRAMADOL (ULTRAM) 50 MG TABLET    Take 1 tablet (50 mg total) by mouth every 6 (six) hours as needed.     Note:  This document was prepared using Dragon voice recognition software and may include unintentional dictation errors.    Faythe Ghee, PA-C 05/06/18 2047    Sharman Cheek, MD 05/10/18 1520

## 2020-06-20 ENCOUNTER — Other Ambulatory Visit: Payer: Self-pay

## 2020-06-20 ENCOUNTER — Emergency Department: Payer: Self-pay

## 2020-06-20 ENCOUNTER — Encounter: Payer: Self-pay | Admitting: Emergency Medicine

## 2020-06-20 ENCOUNTER — Emergency Department
Admission: EM | Admit: 2020-06-20 | Discharge: 2020-06-20 | Disposition: A | Discharge status: Home / Self Care | Payer: Self-pay | Attending: Emergency Medicine | Admitting: Emergency Medicine

## 2020-06-20 DIAGNOSIS — R059 Cough, unspecified: Secondary | ICD-10-CM

## 2020-06-20 DIAGNOSIS — Z20822 Contact with and (suspected) exposure to covid-19: Secondary | ICD-10-CM | POA: Insufficient documentation

## 2020-06-20 DIAGNOSIS — J069 Acute upper respiratory infection, unspecified: Secondary | ICD-10-CM | POA: Insufficient documentation

## 2020-06-20 DIAGNOSIS — R531 Weakness: Secondary | ICD-10-CM | POA: Insufficient documentation

## 2020-06-20 DIAGNOSIS — F17200 Nicotine dependence, unspecified, uncomplicated: Secondary | ICD-10-CM | POA: Insufficient documentation

## 2020-06-20 LAB — RESP PANEL BY RT-PCR (FLU A&B, COVID) ARPGX2
Influenza A by PCR: NEGATIVE
Influenza B by PCR: NEGATIVE
SARS Coronavirus 2 by RT PCR: NEGATIVE

## 2020-06-20 NOTE — ED Provider Notes (Signed)
[facility] Emergency Department Provider Note   ____________________________________________   First MD Initiated Contact with Patient 06/20/20 [phone]     (approximate)  I have reviewed the triage vital signs and the nursing notes.   HISTORY  Chief Complaint General malaise and Cough    HPI Gregory Palmer is a 39 y.o. male past medical history who presents to the ED complaining of generalized malaise.  Patient states that he woke up this morning feeling generally weak and more fatigued than usual.  Is been associated with a dry cough and some nasal congestion.  He denies any chest pain, fever, or shortness of breath, vomiting, or diarrhea.  He has not had any sick contacts and is fully vaccinated against COVID-19.        Past Medical History:  Diagnosis Date  . Rib fractures     There are no problems to display for this patient.   History reviewed. No pertinent surgical history.  Prior to Admission medications   Medication Sig Start Date End Date Taking? Authorizing Provider  amoxicillin (AMOXIL) 500 MG capsule Take 1 capsule (500 mg total) by mouth 3 (three) times daily. 05/06/18   Sherrie Mustache Roselyn Bering, PA-C  traMADol (ULTRAM) 50 MG tablet Take 1 tablet (50 mg total) by mouth every 6 (six) hours as needed. 05/06/18   Faythe Ghee, PA-C    Allergies Patient has no known allergies.  No family history on file.  Social History Social History   Tobacco Use  . Smoking status: Current Every Day Smoker  . Smokeless tobacco: Never Used  Substance Use Topics  . Alcohol use: Yes  . Drug use: Yes    Types: Marijuana    Review of Systems  Constitutional: No fever/chills.  Positive for generalized weakness and malaise. Eyes: No visual changes. ENT: No sore throat.  Positive for congestion. Cardiovascular: Denies chest pain. Respiratory: Denies shortness of breath.  Positive for cough. Gastrointestinal: No abdominal pain.  No nausea, no  vomiting.  No diarrhea.  No constipation. Genitourinary: Negative for dysuria. Musculoskeletal: Negative for back pain. Skin: Negative for rash. Neurological: Negative for headaches, focal weakness or numbness.  ____________________________________________   PHYSICAL EXAM:  VITAL SIGNS: ED Triage Vitals  Enc Vitals Group     BP 06/20/20 0840 (!) 155/92     Pulse Rate 06/20/20 0840 73     Resp 06/20/20 0840 16     Temp 06/20/20 0840 98 F (36.7 C)     Temp Source 06/20/20 0840 Oral     SpO2 06/20/20 0840 100 %     Weight 06/20/20 0841 139 lb 15.9 oz (63.5 kg)     Height 06/20/20 0841 5\' 5"  (1.651 m)     Head Circumference --      Peak Flow --      Pain Score 06/20/20 0841 0     Pain Loc --      Pain Edu? --      Excl. in GC? --     Constitutional: Alert and oriented. Eyes: Conjunctivae are normal. Head: Atraumatic. Nose: No congestion/rhinnorhea. Mouth/Throat: Mucous membranes are moist. Neck: Normal ROM Cardiovascular: Normal rate, regular rhythm. Grossly normal heart sounds. Respiratory: Normal respiratory effort.  No retractions. Lungs CTAB. Gastrointestinal: Soft and nontender. No distention. Genitourinary: deferred Musculoskeletal: No lower extremity tenderness nor edema. Neurologic:  Normal speech and language. No gross focal neurologic deficits are appreciated. Skin:  Skin is warm, dry and intact. No rash noted. Psychiatric: Mood  and affect are normal. Speech and behavior are normal.  ____________________________________________   LABS (all labs ordered are listed, but only abnormal results are displayed)  Labs Reviewed  RESP PANEL BY RT-PCR (FLU A&B, COVID) ARPGX2    PROCEDURES  Procedure(s) performed (including Critical Care):  Procedures   ____________________________________________   INITIAL IMPRESSION / ASSESSMENT AND PLAN / ED COURSE       40 year old male with no significant past medical history presents to the ED complaining of  generalized weakness and malaise since waking up this morning associated with cough and nasal congestion.  He is in no respiratory distress on my evaluation, maintaining O2 sats on room air.  We will check chest x-ray, and if this is unremarkable I suspect COVID-19 or other viral illness.  We will perform PCR testing for COVID-19 and influenza.  Chest x-ray reviewed by me and shows no infiltrate, edema, or effusion.  COVID-19 and influenza testing are negative.  I suspect patient has viral URI and he is appropriate for discharge home with PCP follow-up.  Patient counseled to return to the ED for new worsening symptoms, patient agrees with plan.      ____________________________________________   FINAL CLINICAL IMPRESSION(S) / ED DIAGNOSES  Final diagnoses:  Generalized weakness  Upper respiratory tract infection, unspecified type     ED Discharge Orders    None       Note:  This document was prepared using Dragon voice recognition software and may include unintentional dictation errors.   Chesley Noon, MD 06/20/20 1134

## 2020-06-20 NOTE — ED Triage Notes (Signed)
Patient to ER for c/o generalized malaise last couple of days with dry cough. Denies fever or sore throat.

## 2020-06-20 NOTE — ED Notes (Signed)
Patient verbalizes understanding of discharge instructions. Opportunity for questioning and answers were provided. Armband removed by staff, pt discharged from ED. Ambulated out to lobby  

## 2020-07-18 ENCOUNTER — Other Ambulatory Visit: Payer: Self-pay

## 2020-07-18 ENCOUNTER — Emergency Department
Admission: EM | Admit: 2020-07-18 | Discharge: 2020-07-18 | Disposition: A | Discharge status: Home / Self Care | Payer: Self-pay | Attending: Emergency Medicine | Admitting: Emergency Medicine

## 2020-07-18 ENCOUNTER — Emergency Department: Payer: Self-pay

## 2020-07-18 ENCOUNTER — Encounter: Payer: Self-pay | Admitting: Emergency Medicine

## 2020-07-18 DIAGNOSIS — F17200 Nicotine dependence, unspecified, uncomplicated: Secondary | ICD-10-CM | POA: Insufficient documentation

## 2020-07-18 DIAGNOSIS — J069 Acute upper respiratory infection, unspecified: Secondary | ICD-10-CM | POA: Insufficient documentation

## 2020-07-18 DIAGNOSIS — Z20822 Contact with and (suspected) exposure to covid-19: Secondary | ICD-10-CM | POA: Insufficient documentation

## 2020-07-18 LAB — RESP PANEL BY RT-PCR (FLU A&B, COVID) ARPGX2
Influenza A by PCR: NEGATIVE
Influenza B by PCR: NEGATIVE
SARS Coronavirus 2 by RT PCR: NEGATIVE

## 2020-07-18 NOTE — ED Notes (Signed)
Pt states coming in due to generalized weakness that started last night. Pt up and was walking around room, but now in bed. Pt states he did have a cough as well.

## 2020-07-18 NOTE — ED Notes (Signed)
Pt refuses D/C vitals at this time. NAD noted. Pt ambulatory on D/C.

## 2020-07-18 NOTE — ED Notes (Signed)
Pt resting comfortably at this time. NAD noted. Pt currently walking around the room at this time. Pt refuses vitals at this time and states "they were already done once when I came in".

## 2020-07-18 NOTE — ED Notes (Signed)
X-ray at bedside

## 2020-07-18 NOTE — ED Provider Notes (Signed)
[facility] Emergency Department Provider Note  ____________________________________________  Time seen: Approximately 8:03 AM  I have reviewed the triage vital signs and the nursing notes.   HISTORY  Chief Complaint Cough, Nasal Congestion, and Fatigue    HPI Martine P Mahaney is a 39 y.o. male with no significant past medical history who complains of nonproductive cough fatigue and runny nose that started yesterday.  Waxing and waning, no aggravating or alleviating factors.  No focal pain.  No shortness of breath.  No chest pain.  No fever.  No known sick contacts or high risk Covid exposures.       Past Medical History:  Diagnosis Date  . Rib fractures      There are no problems to display for this patient.    History reviewed. No pertinent surgical history.   Prior to Admission medications   Medication Sig Start Date End Date Taking? Authorizing Provider  amoxicillin (AMOXIL) 500 MG capsule Take 1 capsule (500 mg total) by mouth 3 (three) times daily. 05/06/18   Sherrie Mustache Roselyn Bering, PA-C  traMADol (ULTRAM) 50 MG tablet Take 1 tablet (50 mg total) by mouth every 6 (six) hours as needed. 05/06/18   Faythe Ghee, PA-C     Allergies Patient has no known allergies.   History reviewed. No pertinent family history.  Social History Social History   Tobacco Use  . Smoking status: Current Every Day Smoker  . Smokeless tobacco: Never Used  Substance Use Topics  . Alcohol use: Yes  . Drug use: Yes    Types: Marijuana    Review of Systems  Constitutional:   No fever or chills.  ENT:   No sore throat.  Positive rhinorrhea. Cardiovascular:   No chest pain or syncope. Respiratory:   No dyspnea positive cough. Gastrointestinal:   Negative for abdominal pain, vomiting and diarrhea.  Musculoskeletal:   Negative for focal pain or swelling All other systems reviewed and are negative except as documented above in ROS and  HPI.  ____________________________________________   PHYSICAL EXAM:  VITAL SIGNS: ED Triage Vitals [07/18/20 0601]  Enc Vitals Group     BP (!) 145/93     Pulse Rate 86     Resp 18     Temp 98.4 F (36.9 C)     Temp Source Oral     SpO2 98 %     Weight 139 lb 15.9 oz (63.5 kg)     Height 5\' 5"  (1.651 m)     Head Circumference      Peak Flow      Pain Score 10     Pain Loc      Pain Edu?      Excl. in GC?     Vital signs reviewed, nursing assessments reviewed.   Constitutional:   Alert and oriented. Non-toxic appearance. Eyes:   Conjunctivae are normal. EOMI.  ENT      Head:   Normocephalic and atraumatic.      Nose:   Wearing a mask.      Mouth/Throat:   Wearing a mask.      Neck:   No meningismus. Full ROM.  Cardiovascular:   RRR. Symmetric bilateral radial and DP pulses.  No murmurs. Cap refill less than 2 seconds. Respiratory:   Normal respiratory effort without tachypnea/retractions. Breath sounds are clear and equal bilaterally. No wheezes/rales/rhonchi.  Musculoskeletal:   Normal range of motion in all extremities. Neurologic:   Normal speech and language.  Motor grossly intact. No acute focal neurologic deficits are appreciated.  ____________________________________________    LABS (pertinent positives/negatives) (all labs ordered are listed, but only abnormal results are displayed) Labs Reviewed  RESP PANEL BY RT-PCR (FLU A&B, COVID) ARPGX2   ____________________________________________   EKG    ____________________________________________    RADIOLOGY  DG Chest Portable 1 View  Result Date: 07/18/2020 CLINICAL DATA:  Cough. EXAM: PORTABLE CHEST 1 VIEW COMPARISON:  06/20/2020 FINDINGS: The cardiac silhouette, mediastinal hilar contours are within normal limits. The lungs are clear. No pleural effusions or pulmonary lesions. The bony thorax is intact. IMPRESSION: No acute cardiopulmonary findings. Electronically Signed   By: Rudie Meyer  M.D.   On: 07/18/2020 06:35    ____________________________________________   PROCEDURES Procedures  ____________________________________________    CLINICAL IMPRESSION / ASSESSMENT AND PLAN / ED COURSE  Medications ordered in the ED: Medications - No data to display  Pertinent labs & imaging results that were available during my care of the patient were reviewed by me and considered in my medical decision making (see chart for details).  PURCELL JUNGBLUTH was evaluated in Emergency Department on 07/18/2020 for the symptoms described in the history of present illness. He was evaluated in the context of the global COVID-19 pandemic, which necessitated consideration that the patient might be at risk for infection with the SARS-CoV-2 virus that causes COVID-19. Institutional protocols and algorithms that pertain to the evaluation of patients at risk for COVID-19 are in a state of rapid change based on information released by regulatory bodies including the CDC and federal and state organizations. These policies and algorithms were followed during the patient's care in the ED.   Patient presents with symptoms of viral URI, has nonproductive cough.  Chest x-ray unremarkable, exam unremarkable, vital signs normal, Covid and flu negative.  Counseled him that with only 24 hours of symptoms, viral testing may be unreliable and he should continue to isolate at home pending resolution of symptoms, seek repeat evaluation and testing if symptoms continue or worsen over the next few days.      ____________________________________________   FINAL CLINICAL IMPRESSION(S) / ED DIAGNOSES    Final diagnoses:  Viral URI with cough     ED Discharge Orders    None      Portions of this note were generated with dragon dictation software. Dictation errors may occur despite best attempts at proofreading.   Sharman Cheek, MD 07/18/20 [phone]

## 2020-07-18 NOTE — ED Triage Notes (Signed)
Pt arrived via POV with reports of cough, fatigue, runny nose, sxs started yesterday prior to going to work.   Cough non-productive at this time.  No respiratory distress. Pt due to be at work at 6 am this morning.  Pt will need work note.

## 2020-07-18 NOTE — ED Triage Notes (Signed)
FIRST NURSE NOTE:  Pt here with cough, fatigue, seen here a few weeks ago for similar sxs, dx with URI.  No distress noted at this time.

## 2020-07-18 NOTE — Discharge Instructions (Signed)
Take ibuprofen or naproxen as needed for pain and fever.

## 2020-11-01 ENCOUNTER — Other Ambulatory Visit: Payer: Self-pay

## 2020-11-01 ENCOUNTER — Emergency Department
Admission: EM | Admit: 2020-11-01 | Discharge: 2020-11-01 | Disposition: A | Discharge status: Home / Self Care | Payer: Self-pay | Attending: Emergency Medicine | Admitting: Emergency Medicine

## 2020-11-01 DIAGNOSIS — F17200 Nicotine dependence, unspecified, uncomplicated: Secondary | ICD-10-CM | POA: Insufficient documentation

## 2020-11-01 DIAGNOSIS — K2920 Alcoholic gastritis without bleeding: Secondary | ICD-10-CM | POA: Insufficient documentation

## 2020-11-01 MED ORDER — ONDANSETRON 4 MG PO TBDP
8.0000 mg | ORAL_TABLET | Freq: Once | ORAL | Status: DC
  Filled 2020-11-01: qty 2

## 2020-11-01 MED ORDER — FAMOTIDINE 20 MG PO TABS
40.0000 mg | ORAL_TABLET | Freq: Once | ORAL | Status: DC
  Filled 2020-11-01: qty 2

## 2020-11-01 NOTE — ED Notes (Signed)
Pt not found in room, hallway, or nearby restroom while attempting to administer medication.

## 2020-11-01 NOTE — ED Triage Notes (Signed)
Pt arrives to ER c/o of nausea and vomiting, state "I think I got that bug." started this AM. A&O, ambulatory. Denies fever, states "a few" episodes of diarrhea.

## 2020-11-01 NOTE — ED Provider Notes (Signed)
[facility] Emergency Department Provider Note  ____________________________________________  Time seen: Approximately 11:42 AM  I have reviewed the triage vital signs and the nursing notes.   HISTORY  Chief Complaint Nausea   HPI Gregory Palmer is a 40 y.o. male with no significant past medical history who complains of nausea and states "I just wanted to get checked to make sure I am okay."  Contrary to the triage note, to me he denies vomiting or diarrhea.  No constipation.  States he was in his usual state of health up through last night.  He was drinking heavily last night, went to bed intoxicated.  When he woke up he felt that he had slept off the alcohol, but when he drank some soda this morning he reports that still tasted like alcohol.  Has not eaten yet today.  No fever.  Denies pain.      Past Medical History:  Diagnosis Date  . Rib fractures      There are no problems to display for this patient.    History reviewed. No pertinent surgical history.   Prior to Admission medications   Medication Sig Start Date End Date Taking? Authorizing Provider  amoxicillin (AMOXIL) 500 MG capsule Take 1 capsule (500 mg total) by mouth 3 (three) times daily. 05/06/18   Sherrie Mustache Roselyn Bering, PA-C  traMADol (ULTRAM) 50 MG tablet Take 1 tablet (50 mg total) by mouth every 6 (six) hours as needed. 05/06/18   Faythe Ghee, PA-C     Allergies Patient has no known allergies.   History reviewed. No pertinent family history.  Social History Social History   Tobacco Use  . Smoking status: Current Every Day Smoker  . Smokeless tobacco: Never Used  Substance Use Topics  . Alcohol use: Yes  . Drug use: Yes    Types: Marijuana    Review of Systems  Constitutional:   No fever or chills.  ENT:   No sore throat. No rhinorrhea. Cardiovascular:   No chest pain or syncope. Respiratory:   No dyspnea or cough. Gastrointestinal:   Negative for abdominal  pain, vomiting and diarrhea.  Musculoskeletal:   Negative for focal pain or swelling All other systems reviewed and are negative except as documented above in ROS and HPI.  ____________________________________________   PHYSICAL EXAM:  VITAL SIGNS: ED Triage Vitals  Enc Vitals Group     BP 11/01/20 1002 (!) 135/98     Pulse Rate 11/01/20 1002 77     Resp 11/01/20 1002 18     Temp 11/01/20 1002 98.3 F (36.8 C)     Temp Source 11/01/20 1002 Oral     SpO2 11/01/20 1002 95 %     Weight 11/01/20 1008 157 lb (71.2 kg)     Height 11/01/20 1008 5\' 5"  (1.651 m)     Head Circumference --      Peak Flow --      Pain Score 11/01/20 1008 0     Pain Loc --      Pain Edu? --      Excl. in GC? --     Vital signs reviewed, nursing assessments reviewed.   Constitutional:   Alert and oriented. Non-toxic appearance. Eyes:   Conjunctivae are normal. EOMI. PERRL. ENT      Head:   Normocephalic and atraumatic.      Nose:   Wearing a mask.      Mouth/Throat:   Wearing a mask.  Neck:   No meningismus. Full ROM. Hematological/Lymphatic/Immunilogical:   No cervical lymphadenopathy. Cardiovascular:   RRR. Symmetric bilateral radial and DP pulses.  No murmurs. Cap refill less than 2 seconds. Respiratory:   Normal respiratory effort without tachypnea/retractions. Breath sounds are clear and equal bilaterally. No wheezes/rales/rhonchi. Gastrointestinal:   Soft and nontender. Non distended. There is no CVA tenderness.  No rebound, rigidity, or guarding. Genitourinary:   deferred Musculoskeletal:   Normal range of motion in all extremities. No joint effusions.  No lower extremity tenderness.  No edema. Neurologic:   Normal speech and language.  Motor grossly intact. No acute focal neurologic deficits are appreciated.  Skin:    Skin is warm, dry and intact. No rash noted.  No petechiae, purpura, or bullae.  ____________________________________________    LABS (pertinent  positives/negatives) (all labs ordered are listed, but only abnormal results are displayed) Labs Reviewed - No data to display ____________________________________________   EKG    ____________________________________________    RADIOLOGY  No results found.  ____________________________________________   PROCEDURES Procedures  ____________________________________________    CLINICAL IMPRESSION / ASSESSMENT AND PLAN / ED COURSE  Medications ordered in the ED: Medications  ondansetron (ZOFRAN-ODT) disintegrating tablet 8 mg (has no administration in time range)  famotidine (PEPCID) tablet 40 mg (has no administration in time range)    Pertinent labs & imaging results that were available during my care of the patient were reviewed by me and considered in my medical decision making (see chart for details).  Gregory Palmer was evaluated in Emergency Department on 11/01/2020 for the symptoms described in the history of present illness. He was evaluated in the context of the global COVID-19 pandemic, which necessitated consideration that the patient might be at risk for infection with the SARS-CoV-2 virus that causes COVID-19. Institutional protocols and algorithms that pertain to the evaluation of patients at risk for COVID-19 are in a state of rapid change based on information released by regulatory bodies including the CDC and federal and state organizations. These policies and algorithms were followed during the patient's care in the ED.   Patient presents with nausea which I think is due to alcoholic gastritis/GERD. Considering the patient's symptoms, medical history, and physical examination today, I have low suspicion for cholecystitis or biliary pathology, pancreatitis, perforation or bowel obstruction, hernia, intra-abdominal abscess, AAA or dissection, volvulus or intussusception, mesenteric ischemia, or appendicitis.  No evidence of any cardiovascular disease.  No  signs of normal.  Offered Zofran and Pepcid in the ED.  Stable for discharge.  Unfortunately patient left the ED before printed discharge instructions could be provided for him.      ____________________________________________   FINAL CLINICAL IMPRESSION(S) / ED DIAGNOSES    Final diagnoses:  Acute alcoholic gastritis without hemorrhage     ED Discharge Orders    None      Portions of this note were generated with dragon dictation software. Dictation errors may occur despite best attempts at proofreading.   Sharman Cheek, MD 11/01/20 1149

## 2020-11-01 NOTE — ED Notes (Signed)
Dr. Stafford at bedside.  

## 2022-08-08 IMAGING — DX DG CHEST 1V PORT
1 series · 1 of 1 positions shown · non-contrast
Comparison: 06/20/2020

CLINICAL DATA: Cough.

EXAM:
PORTABLE CHEST 1 VIEW

[chest ap]
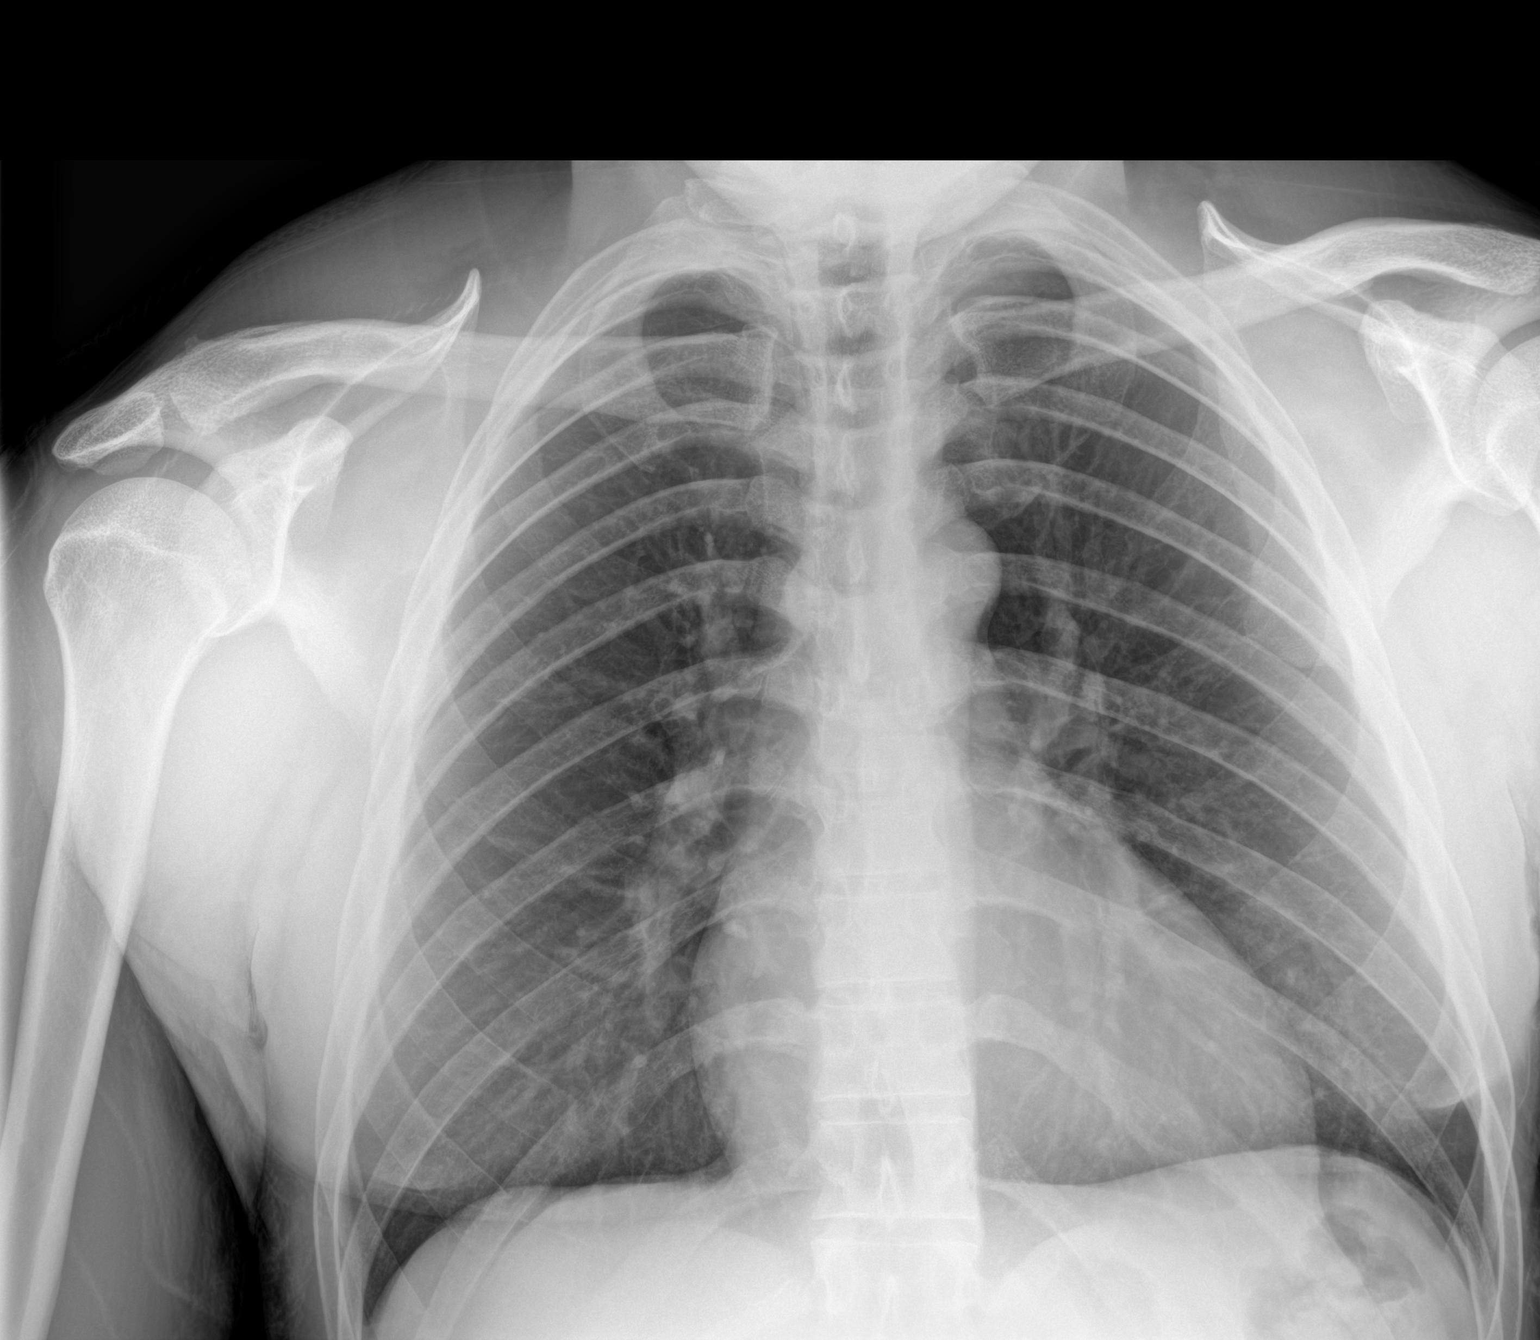

[1 of 1 positions shown; findings below may reference images not displayed]

FINDINGS: The cardiac silhouette, mediastinal hilar contours are within normal
limits. The lungs are clear. No pleural effusions or pulmonary
lesions. The bony thorax is intact.
IMPRESSION: No acute cardiopulmonary findings.

## 2022-09-22 ENCOUNTER — Emergency Department
Admission: EM | Admit: 2022-09-22 | Discharge: 2022-09-22 | Disposition: A | Discharge status: Home / Self Care | Payer: No Typology Code available for payment source | Attending: Emergency Medicine | Admitting: Emergency Medicine

## 2022-09-22 ENCOUNTER — Encounter: Payer: Self-pay | Admitting: Intensive Care

## 2022-09-22 ENCOUNTER — Emergency Department: Payer: No Typology Code available for payment source

## 2022-09-22 ENCOUNTER — Other Ambulatory Visit: Payer: Self-pay

## 2022-09-22 DIAGNOSIS — Y92410 Unspecified street and highway as the place of occurrence of the external cause: Secondary | ICD-10-CM | POA: Insufficient documentation

## 2022-09-22 DIAGNOSIS — S161XXA Strain of muscle, fascia and tendon at neck level, initial encounter: Secondary | ICD-10-CM | POA: Insufficient documentation

## 2022-09-22 DIAGNOSIS — G44319 Acute post-traumatic headache, not intractable: Secondary | ICD-10-CM | POA: Insufficient documentation

## 2022-09-22 DIAGNOSIS — S39012A Strain of muscle, fascia and tendon of lower back, initial encounter: Secondary | ICD-10-CM | POA: Diagnosis not present

## 2022-09-22 DIAGNOSIS — S199XXA Unspecified injury of neck, initial encounter: Secondary | ICD-10-CM | POA: Diagnosis present
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
## 2022-09-22 NOTE — ED Notes (Signed)
Patient ambulated to room with a steady gait. Patient denies numbness to extremities.

## 2022-09-22 NOTE — Discharge Instructions (Signed)
Follow-up with your primary care provider or [place] urgent care if any continued problems or concerns.  X-rays and CT scan were negative however you may experience soreness and stiffness for approximately 4 to 5 days.  You can take Tylenol and ibuprofen over-the-counter as needed for discomfort.  Also ice and heat to your muscles as needed for discomfort may also give you some relief.
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
